# Patient Record
Sex: Male | Born: 2000 | Race: White | Hispanic: No | Marital: Single | Smoking: Never smoker
Health system: Southern US, Community
[De-identification: ages and names within clinical notes are randomized; demographics above are authoritative.]

## PROBLEM LIST (undated history)

## (undated) DIAGNOSIS — G43909 Migraine, unspecified, not intractable, without status migrainosus: Secondary | ICD-10-CM

## (undated) HISTORY — PX: OTHER SURGICAL HISTORY: SHX169

---
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
## 2020-02-22 NOTE — Patient Instructions (Addendum)
Health Maintenance Due  Topic Date Due  . Hepatitis C Screening - declines/low risk  Never done  . HIV Screening - declines/low risk Never done  . TETANUS/TDAP - today  05/03/2019   Team- please log covid 19 vaccine  Recommended follow up: 1-2 year physical. Get baseline bloodwork at that time since you declined for now.

## 2020-02-22 NOTE — Progress Notes (Signed)
Phone: [phone]   Subjective:  Patient presents today to establish care.  Prior patient of Dr. Clarnce Flock with [facility] in [place], [place].  Chief Complaint  Patient presents with  . New Patient (Initial Visit)    requesting Tdap - last 05/02/2009 per [facility]   See problem oriented charting  Review of Systems  Constitutional: Negative for chills and fever.  HENT: Negative for congestion, ear pain, hearing loss and nosebleeds.   Eyes: Negative for blurred vision and double vision.  Respiratory: Negative for cough and shortness of breath.   Cardiovascular: Negative for chest pain and palpitations.  Gastrointestinal: Negative for constipation, diarrhea, nausea and vomiting.  Genitourinary: Negative for dysuria and frequency.  Musculoskeletal: Negative for falls and myalgias.  Skin: Negative for itching and rash.  Neurological: Negative for dizziness and headaches.  Endo/Heme/Allergies: Negative for polydipsia. Does not bruise/bleed easily.  Psychiatric/Behavioral: Negative for depression, hallucinations, substance abuse and suicidal ideas.     The following were reviewed and entered/updated in epic: Past Medical History:  Diagnosis Date  . Migraines    in childhood saw neurology- has grown out of this   There are no problems to display for this patient.  Past Surgical History:  Procedure Laterality Date  . adnoidectomy    . knee Left    partial knee reconstruction including ACL and medial meniscectomy   . tubes in his ears      Family History  Problem Relation Age of Onset  . Migraines Mother   . Colon cancer Father        age 80  . Other Father        lattice peripheral retinal degeneration of both eyes  . ADD / ADHD Brother   . Brain cancer Maternal Grandmother   . Dementia Maternal Grandfather   . Kidney failure Maternal Grandfather     Medications- reviewed and updated Current Outpatient Medications  Medication Sig Dispense Refill  . MAGNESIUM SULFATE  PO Take by mouth. (Patient not taking: Reported on 02/23/2020)     No current facility-administered medications for this visit.    Allergies-reviewed and updated Allergies  Allergen Reactions  . Promethazine-Codeine Rash    Social History   Social History Narrative   Lives with 8 guys in a house junior year starting fall 2021. Parents and brother Jerilee Hoh sees Dr. 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       Hobbies: reading, working out, tv, foodie and enjoys cooking and going out to eat.     Objective  Objective:  BP (!) 138/88   Pulse 67   Temp (!) 97.5 F (36.4 C) (Temporal)   Ht 5\' 11"  (1.803 m)   Wt 194 lb 3.2 oz (88.1 kg)   SpO2 98%   BMI 27.09 kg/m  Gen: NAD, resting comfortably HEENT: Mucous membranes are moist. Oropharynx normal. TM normal. Eyes: sclera and lids normal, PERRLA Neck: no thyromegaly, no cervical lymphadenopathy CV: RRR no murmurs rubs or gallops Lungs: CTAB no crackles, wheeze, rhonchi Abdomen: soft/nontender/nondistended/normal bowel sounds. No rebound or guarding.  Ext: no edema Skin: warm, dry Neuro: 5/5 strength in upper and lower extremities, normal gait, normal reflexes    Assessment and Plan:   19 y.o. male presenting for annual physical.  Health Maintenance counseling: 1. Anticipatory guidance: Patient counseled regarding regular dental exams -q6 months, eye exams - yearly with contacts,  avoiding smoking and second hand smoke , limiting alcohol to 2 beverages per day- doesn't  drink as underage.   2. Risk factor reduction:  Advised patient of need for regular exercise and diet rich and fruits and vegetables to reduce risk of heart attack and stroke. Exercise- excellent! Hydrographic surveyor. Diet-reasonably healthy.  Wt Readings from Last 3 Encounters:  02/23/20 194 lb 3.2 oz (88.1 kg)  06/10/12 140 lb (63.5 kg) (92 %, Z= 1.43)*   * Growth percentiles are based on CDC (Boys, 2-20 Years) data.  3.  Immunizations/screenings/ancillary studies- Tdap today . Declines hiv and hep c screen. covid vaccine will be logged 4. Prostate cancer screening- no family history, start at age 52  5. Colon cancer screening - family history in dad at 46- will screen age 22 6. Skin cancer screening/prevention- sees dermatology. advised regular sunscreen use. Denies worrisome, changing, or new skin lesions.  7. Testicular cancer screening- advised monthly self exams  8. STD screening- patient opts out using abstinence 9. Never smoker-   Status of chronic or acute concerns   Screen hyperlipidemia- we discussed this and he would like to hold off for now  Feels well overall and we also opted out of cbc, cmp baseline as well  Recommended follow up: 1-2 year physical. Get baseline bloodwork at that time.   Lab/Order associations: no labs today    ICD-10-CM   1. Preventative health care  Z00.00    Return precautions advised.  Tana Conch, MD

## 2020-02-23 ENCOUNTER — Other Ambulatory Visit: Payer: Self-pay

## 2020-02-23 ENCOUNTER — Encounter: Payer: Self-pay | Admitting: Family Medicine

## 2020-02-23 ENCOUNTER — Ambulatory Visit (INDEPENDENT_AMBULATORY_CARE_PROVIDER_SITE_OTHER): Payer: No Typology Code available for payment source | Admitting: Family Medicine

## 2020-02-23 VITALS — BP 138/88 | HR 67 | Temp 97.5°F | Ht 71.0 in | Wt 194.2 lb

## 2020-02-23 DIAGNOSIS — Z0000 Encounter for general adult medical examination without abnormal findings: Secondary | ICD-10-CM

## 2020-02-23 DIAGNOSIS — Z23 Encounter for immunization: Secondary | ICD-10-CM

## 2020-02-23 NOTE — Addendum Note (Signed)
Addended by: Shelva Majestic on: 02/23/2020 12:25 PM   Modules accepted: Level of Service

## 2020-02-23 NOTE — Addendum Note (Signed)
Addended by: Laddie Aquas A on: 02/23/2020 10:36 AM   Modules accepted: Orders
